# Patient Record
Sex: Male | Born: 1985 | Race: White | Hispanic: No | Marital: Single | Smoking: Former smoker
Health system: Southern US, Community
[De-identification: ages and names within clinical notes are randomized; demographics above are authoritative.]

---

## 2007-02-06 ENCOUNTER — Emergency Department: Payer: Self-pay | Admitting: Emergency Medicine

## 2008-10-01 ENCOUNTER — Emergency Department: Payer: Self-pay | Admitting: Emergency Medicine

## 2011-02-14 ENCOUNTER — Emergency Department: Payer: Self-pay | Admitting: Emergency Medicine

## 2012-01-09 ENCOUNTER — Emergency Department: Payer: Self-pay | Admitting: *Deleted

## 2012-01-09 LAB — COMPREHENSIVE METABOLIC PANEL
Albumin: 4.8 g/dL (ref 3.4–5.0)
BUN: 15 mg/dL (ref 7–18)
Bilirubin,Total: 0.4 mg/dL (ref 0.2–1.0)
Chloride: 102 mmol/L (ref 98–107)
Creatinine: 0.94 mg/dL (ref 0.60–1.30)
EGFR (Non-African Amer.): >60
Osmolality: 282 (ref 275–301)
Potassium: 3.5 mmol/L (ref 3.5–5.1)
SGOT(AST): 22 U/L (ref 15–37)
SGPT (ALT): 26 U/L
Sodium: 140 mmol/L (ref 136–145)
Total Protein: 8.3 g/dL — ABNORMAL HIGH (ref 6.4–8.2)

## 2012-01-09 LAB — LIPASE, BLOOD: Lipase: 59 U/L — ABNORMAL LOW (ref 73–393)

## 2012-01-09 LAB — CBC WITH DIFFERENTIAL/PLATELET
Basophil #: 0 10*3/uL (ref 0.0–0.1)
Basophil %: 0.2 %
Eosinophil #: 0 10*3/uL (ref 0.0–0.7)
HCT: 45.1 % (ref 40.0–52.0)
HGB: 15.3 g/dL (ref 13.0–18.0)
Lymphocyte #: 1.1 10*3/uL (ref 1.0–3.6)
Lymphocyte %: 7.8 %
MCHC: 34 g/dL (ref 32.0–36.0)
Monocyte #: 0.9 10*3/uL — ABNORMAL HIGH (ref 0.0–0.7)
Monocyte %: 6.3 %
Neutrophil #: 12 10*3/uL — ABNORMAL HIGH (ref 1.4–6.5)
Neutrophil %: 85.5 %
WBC: 14 10*3/uL — ABNORMAL HIGH (ref 3.8–10.6)

## 2012-01-10 LAB — URINALYSIS, COMPLETE
Leukocyte Esterase: NEGATIVE
Nitrite: NEGATIVE
Protein: 30
RBC,UR: 7 /HPF (ref 0–5)
Squamous Epithelial: NONE SEEN
WBC UR: 2 /HPF (ref 0–5)

## 2012-04-05 ENCOUNTER — Emergency Department: Payer: Self-pay | Admitting: Emergency Medicine

## 2014-06-13 ENCOUNTER — Emergency Department: Payer: Self-pay | Admitting: Emergency Medicine

## 2016-09-05 ENCOUNTER — Emergency Department: Payer: No Typology Code available for payment source

## 2016-09-05 ENCOUNTER — Emergency Department
Admission: EM | Admit: 2016-09-05 | Discharge: 2016-09-05 | Disposition: A | Discharge status: Home / Self Care | Payer: No Typology Code available for payment source | Attending: Emergency Medicine | Admitting: Emergency Medicine

## 2016-09-05 DIAGNOSIS — S70211A Abrasion, right hip, initial encounter: Secondary | ICD-10-CM | POA: Insufficient documentation

## 2016-09-05 DIAGNOSIS — S50812A Abrasion of left forearm, initial encounter: Secondary | ICD-10-CM | POA: Insufficient documentation

## 2016-09-05 DIAGNOSIS — S20312A Abrasion of left front wall of thorax, initial encounter: Secondary | ICD-10-CM | POA: Diagnosis not present

## 2016-09-05 DIAGNOSIS — Y9389 Activity, other specified: Secondary | ICD-10-CM | POA: Insufficient documentation

## 2016-09-05 DIAGNOSIS — Y999 Unspecified external cause status: Secondary | ICD-10-CM | POA: Insufficient documentation

## 2016-09-05 DIAGNOSIS — Y92410 Unspecified street and highway as the place of occurrence of the external cause: Secondary | ICD-10-CM | POA: Diagnosis not present

## 2016-09-05 DIAGNOSIS — R0789 Other chest pain: Secondary | ICD-10-CM

## 2016-09-05 DIAGNOSIS — S59912A Unspecified injury of left forearm, initial encounter: Secondary | ICD-10-CM | POA: Diagnosis present

## 2016-09-05 DIAGNOSIS — S50811A Abrasion of right forearm, initial encounter: Secondary | ICD-10-CM | POA: Diagnosis not present

## 2016-09-05 DIAGNOSIS — T148XXA Other injury of unspecified body region, initial encounter: Secondary | ICD-10-CM

## 2016-09-05 MED ORDER — IBUPROFEN 600 MG PO TABS: 600.0000 mg | ORAL_TABLET | Freq: Three times a day (TID) | ORAL | 0 refills | Status: AC | PRN

## 2016-09-05 MED ORDER — IBUPROFEN 600 MG PO TABS
600.0000 mg | ORAL_TABLET | Freq: Once | ORAL | Status: AC
  Administered 2016-09-05: 600 mg via ORAL
  Filled 2016-09-05: qty 1

## 2016-09-05 MED ORDER — HYDROCODONE-ACETAMINOPHEN 5-325 MG PO TABS
1.0000 | ORAL_TABLET | Freq: Once | ORAL | Status: AC
  Administered 2016-09-05: 1 via ORAL
  Filled 2016-09-05: qty 1

## 2016-09-05 MED ORDER — HYDROCODONE-ACETAMINOPHEN 5-325 MG PO TABS: 1.0000 | ORAL_TABLET | Freq: Four times a day (QID) | ORAL | 0 refills | Status: AC | PRN

## 2016-09-05 NOTE — Discharge Instructions (Signed)
1.  You may take pain medicines as needed (Motrin/Norco #15). °2.  Return to the ER for worsening symptoms, persistent vomiting, difficulty breathing or other concerns. °
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
## 2016-09-05 NOTE — ED Notes (Signed)
Pt calling family for ride 
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
## 2017-05-02 ENCOUNTER — Emergency Department
Admission: EM | Admit: 2017-05-02 | Discharge: 2017-05-02 | Disposition: A | Discharge status: Home / Self Care | Payer: Self-pay | Attending: Emergency Medicine | Admitting: Emergency Medicine

## 2017-05-02 ENCOUNTER — Emergency Department: Payer: Self-pay

## 2017-05-02 ENCOUNTER — Encounter: Payer: Self-pay | Admitting: Emergency Medicine

## 2017-05-02 DIAGNOSIS — R609 Edema, unspecified: Secondary | ICD-10-CM

## 2017-05-02 DIAGNOSIS — F17290 Nicotine dependence, other tobacco product, uncomplicated: Secondary | ICD-10-CM | POA: Insufficient documentation

## 2017-05-02 DIAGNOSIS — Z791 Long term (current) use of non-steroidal anti-inflammatories (NSAID): Secondary | ICD-10-CM | POA: Insufficient documentation

## 2017-05-02 DIAGNOSIS — R600 Localized edema: Secondary | ICD-10-CM | POA: Insufficient documentation

## 2017-05-02 DIAGNOSIS — Z79899 Other long term (current) drug therapy: Secondary | ICD-10-CM | POA: Insufficient documentation

## 2017-05-02 LAB — CBC WITH DIFFERENTIAL/PLATELET
BASOS ABS: 0 10*3/uL (ref 0–0.1)
Basophils Relative: 1 %
Eosinophils Absolute: 0.4 10*3/uL (ref 0–0.7)
Eosinophils Relative: 7 %
HEMATOCRIT: 44.6 % (ref 40.0–52.0)
Hemoglobin: 15.3 g/dL (ref 13.0–18.0)
LYMPHS PCT: 32 %
Lymphs Abs: 1.8 10*3/uL (ref 1.0–3.6)
MCH: 30.3 pg (ref 26.0–34.0)
MCHC: 34.3 g/dL (ref 32.0–36.0)
MCV: 88.3 fL (ref 80.0–100.0)
MONO ABS: 0.5 10*3/uL (ref 0.2–1.0)
MONOS PCT: 10 %
NEUTROS ABS: 2.8 10*3/uL (ref 1.4–6.5)
Neutrophils Relative %: 50 %
Platelets: 209 10*3/uL (ref 150–440)
RBC: 5.05 MIL/uL (ref 4.40–5.90)
RDW: 13.3 % (ref 11.5–14.5)
WBC: 5.5 10*3/uL (ref 3.8–10.6)

## 2017-05-02 LAB — BASIC METABOLIC PANEL
ANION GAP: 7 (ref 5–15)
BUN: 10 mg/dL (ref 6–20)
CALCIUM: 9.3 mg/dL (ref 8.9–10.3)
CHLORIDE: 105 mmol/L (ref 101–111)
CO2: 29 mmol/L (ref 22–32)
Creatinine, Ser: 0.95 mg/dL (ref 0.61–1.24)
GFR calc Af Amer: >60 mL/min (ref >60)
GFR calc non Af Amer: >60 mL/min (ref >60)
GLUCOSE: 87 mg/dL (ref 65–99)
Potassium: 3.9 mmol/L (ref 3.5–5.1)
Sodium: 141 mmol/L (ref 135–145)

## 2017-05-02 LAB — CK: Total CK: 134 U/L (ref 49–397)

## 2017-05-02 NOTE — ED Triage Notes (Addendum)
Pt presents to ED with intermittent swelling to his left lower leg for the past week. reddened areas noted to the back of both legs and front of left ankle. Pt states both legs do swell occasionally but reports mostly the left leg is affected. No redness or heat noted to affected leg. Swelling present.

## 2017-05-02 NOTE — Discharge Instructions (Signed)
Elevate leg whenever possible to reduce swelling. Return to the ER for worsening symptoms, difficulty breathing, redness or streaking to leg, or other concerns.

## 2017-05-02 NOTE — ED Provider Notes (Signed)
[facility] Emergency Department Provider Note   ____________________________________________   First MD Initiated Contact with Patient 05/02/17 0401     (approximate)  I have reviewed the triage vital signs and the nursing notes.   HISTORY  Chief Complaint Leg Swelling    HPI Trevor Melendez is a 31 y.o. male who presents to the ED from home with a chief complaint of left lower leg swelling.Patient reports intermittent swelling for the past week. Has had bug bites to both legs and calves, but the left one seems to be more affected. Denies associated fever, chills, chest pain, shortness of breath, abdominal pain, nausea, vomiting, diarrhea. Denies recent travel or trauma. Denies taking creatine supplements.   Past medical history None  There are no active problems to display for this patient.   History reviewed. No pertinent surgical history.  Prior to Admission medications   Medication Sig Start Date End Date Taking? Authorizing Provider  HYDROcodone-acetaminophen (NORCO) 5-325 MG tablet Take 1 tablet by mouth every 6 (six) hours as needed for moderate pain. 09/05/16   Irean HongSung, Jiovany Scheffel J, MD  ibuprofen (ADVIL,MOTRIN) 600 MG tablet Take 1 tablet (600 mg total) by mouth every 8 (eight) hours as needed. 09/05/16   Irean HongSung, Suhaan Perleberg J, MD    Allergies Patient has no known allergies.  No family history on file.  Social History Social History  Substance Use Topics  . Smoking status: Current Every Day Smoker    Types: E-cigarettes  . Smokeless tobacco: Not on file  . Alcohol use No    Review of Systems  Constitutional: No fever/chills. Eyes: No visual changes. ENT: No sore throat. Cardiovascular: Denies chest pain. Respiratory: Denies shortness of breath. Gastrointestinal: No abdominal pain.  No nausea, no vomiting.  No diarrhea.  No constipation. Genitourinary: Negative for dysuria. Musculoskeletal: Positive for left leg pain and swelling. Negative  for back pain. Skin: Negative for rash. Neurological: Negative for headaches, focal weakness or numbness.   ____________________________________________   PHYSICAL EXAM:  VITAL SIGNS: ED Triage Vitals  Enc Vitals Group     BP 05/02/17 0307 (!) 144/73     Pulse Rate 05/02/17 0307 73     Resp 05/02/17 0307 18     Temp 05/02/17 0307 98 F (36.7 C)     Temp Source 05/02/17 0307 Oral     SpO2 05/02/17 0307 97 %     Weight 05/02/17 0308 180 lb (81.6 kg)     Height 05/02/17 0308 6\' 3"  (1.905 m)     Head Circumference --      Peak Flow --      Pain Score 05/02/17 0346 1     Pain Loc --      Pain Edu? --      Excl. in GC? --     Constitutional: Alert and oriented. Well appearing and in no acute distress. Eyes: Conjunctivae are normal. PERRL. EOMI. Head: Atraumatic. Nose: No congestion/rhinnorhea. Mouth/Throat: Mucous membranes are moist.  Oropharynx non-erythematous. Neck: No stridor.   Cardiovascular: Normal rate, regular rhythm. Grossly normal heart sounds.  Good peripheral circulation. Respiratory: Normal respiratory effort.  No retractions. Lungs CTAB. Gastrointestinal: Soft and nontender. No distention. No abdominal bruits. No CVA tenderness. Musculoskeletal:  Right calf: 38 cm Left calf: 39 cm. Calf is supple but tender to palpation. No warmth or erythema. 2+ distal pulses. Neurologic:  Normal speech and language. No gross focal neurologic deficits are appreciated.  Skin:  Skin is warm, dry and intact.  No rash noted. Psychiatric: Mood and affect are normal. Speech and behavior are normal.  ____________________________________________   LABS (all labs ordered are listed, but only abnormal results are displayed)  Labs Reviewed  CBC WITH DIFFERENTIAL/PLATELET  BASIC METABOLIC PANEL  CK   ____________________________________________  EKG  None ____________________________________________  RADIOLOGY  US Venous Img Lower Unilateral Left  Result Date:  05/02/2017 CLINICAL DATA:  LEFT calf pain and swelling for 1 week. EXAM: LEFT LOWER EXTREMITY VENOUS DOPPLER ULTRASOUND TECHNIQUE: Gray-scale sonography with graded compression, as well as color Doppler and duplex ultrasound were performed to evaluate the lower extremity deep venous systems from the level of the common femoral vein and including the common femoral, femoral, profunda femoral, popliteal and calf veins including the posterior tibial, peroneal and gastrocnemius veins when visible. The superficial great saphenous vein was also interrogated. Spectral Doppler was utilized to evaluate flow at rest and with distal augmentation maneuvers in the common femoral, femoral and popliteal veins. COMPARISON:  None. FINDINGS: Contralateral Common Femoral Vein: Respiratory phasicity is normal and symmetric with the symptomatic side. No evidence of thrombus. Normal compressibility. Common Femoral Vein: No evidence of thrombus. Normal compressibility, respiratory phasicity and response to augmentation. Saphenofemoral Junction: No evidence of thrombus. Normal compressibility and flow on color Doppler imaging. Profunda Femoral Vein: No evidence of thrombus. Normal compressibility and flow on color Doppler imaging. Femoral Vein (duplicated): No evidence of thrombus. Normal compressibility, respiratory phasicity and response to augmentation. Popliteal Vein: No evidence of thrombus. Normal compressibility, respiratory phasicity and response to augmentation. Calf Veins: No evidence of thrombus. Normal compressibility and flow on color Doppler imaging. Superficial Great Saphenous Vein: No evidence of thrombus. Normal compressibility and flow on color Doppler imaging. Other Findings:  None. IMPRESSION: No LEFT lower extremity deep vein thrombosis. Electronically Signed   By: Awilda Metro M.D.   On: 05/02/2017 05:33    ____________________________________________   PROCEDURES  Procedure(s) performed:  None  Procedures  Critical Care performed: No  ____________________________________________   INITIAL IMPRESSION / ASSESSMENT AND PLAN / ED COURSE  Pertinent labs & imaging results that were available during my care of the patient were reviewed by me and considered in my medical decision making (see chart for details).  31 year old male who presents with left lower leg pain and swelling. Will check screening lab work including CK and obtain Doppler ultrasound to evaluate for DVT.  Clinical Course as of May 02 732  Tue May 02, 2017  0636 Updated patient of laboratory imaging results. Reexamined left leg; there are several insect bites but none appear to be infected. Do not feel antibiotics are warranted at this time. Advised patient to elevate his extremity whenever possible and to follow-up with his PCP closely. Strict return precautions given. Patient verbalizes understanding and agrees with plan of care.  [JS]    Clinical Course User Index [JS] Irean Hong, MD     ____________________________________________   FINAL CLINICAL IMPRESSION(S) / ED DIAGNOSES  Final diagnoses:  Peripheral edema      NEW MEDICATIONS STARTED DURING THIS VISIT:  Discharge Medication List as of 05/02/2017  6:38 AM       Note:  This document was prepared using Dragon voice recognition software and may include unintentional dictation errors.    Irean Hong, MD 05/02/17 [phone]

## 2017-05-02 NOTE — ED Notes (Signed)
Pt discharged to home.  Family member driving.  Discharge instructions reviewed.  Verbalized understanding.  No questions or concerns at this time.  Teach back verified.  Pt in NAD.  No items left in ED.   

## 2018-05-24 IMAGING — US US EXTREM LOW VENOUS*L*
1 series · 13 of 24 positions shown · non-contrast
Comparison: None.

CLINICAL DATA: LEFT calf pain and swelling for 1 week.



[Series 1: us extrem low venous*left* · 0.06mm/px · 13 of 38 slices shown]
[im 1/38]
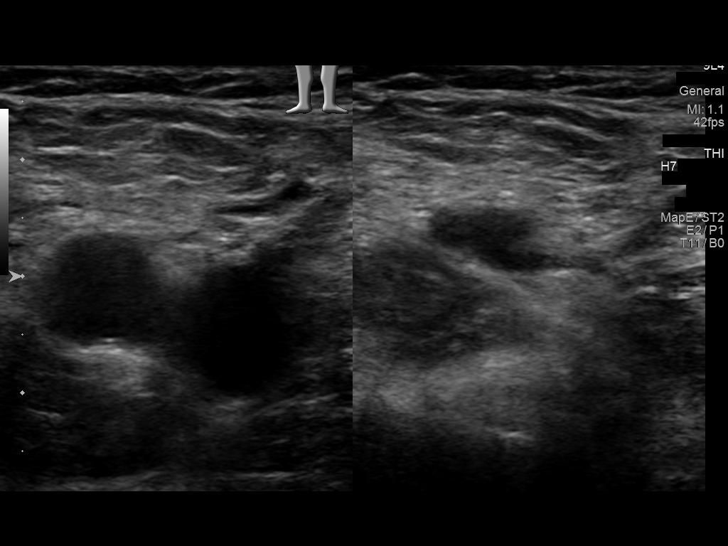
[im 4/38]
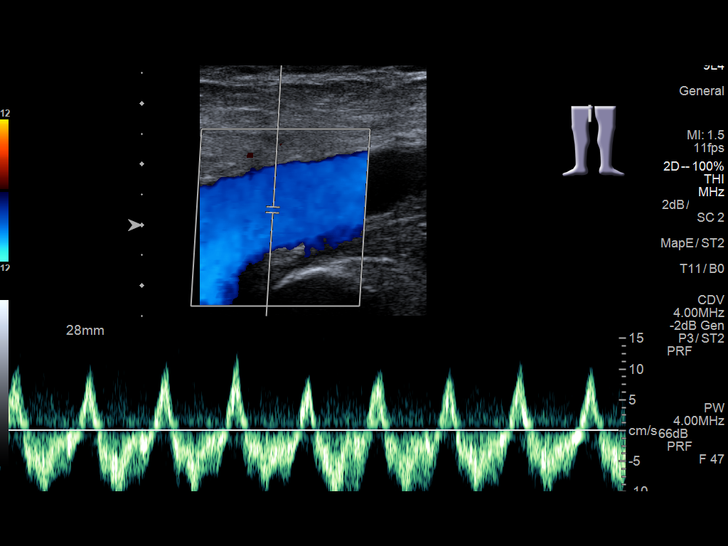
[im 7/38]
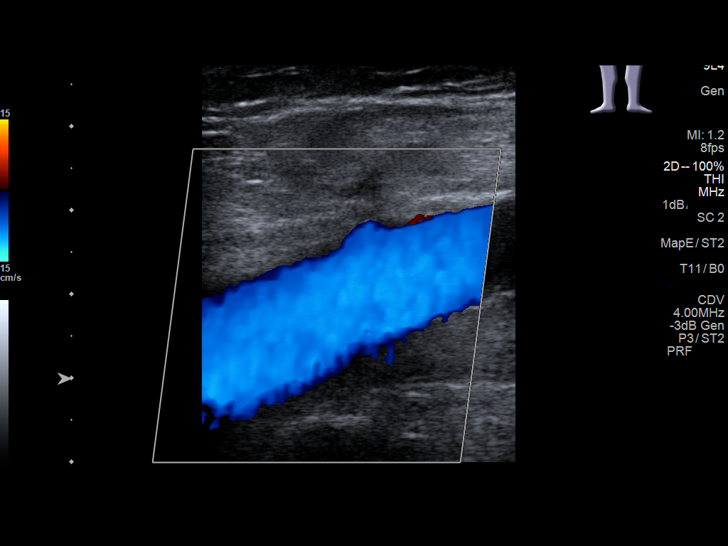
[im 10/38]
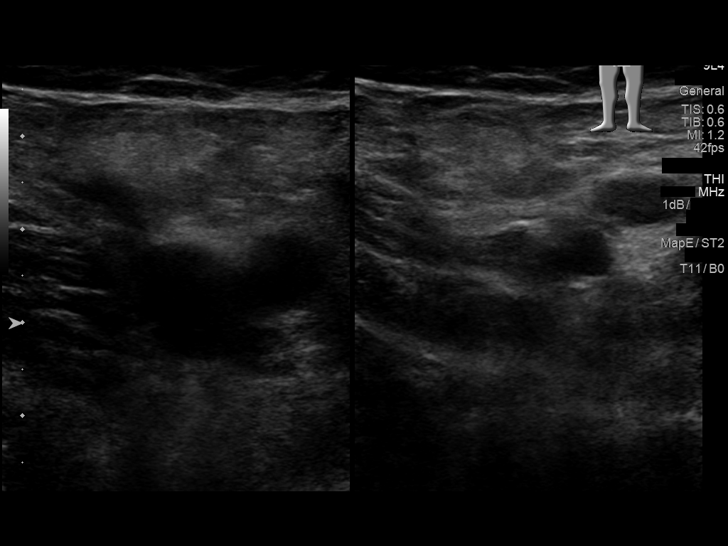
[im 13/38]
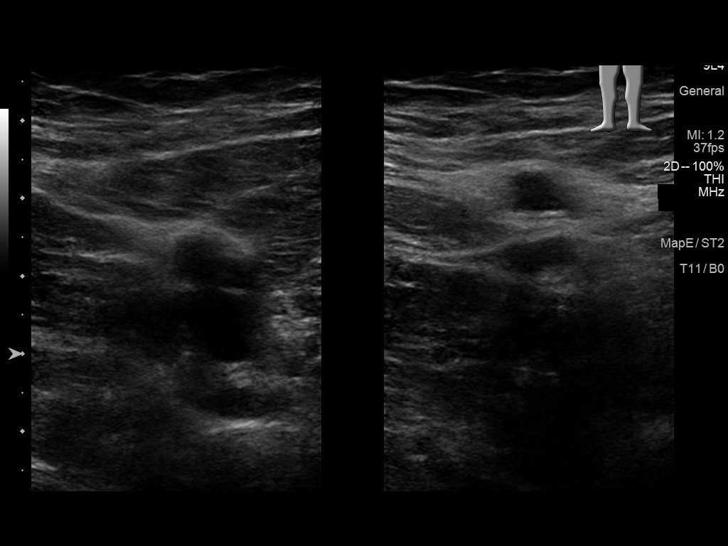
[im 17/38]
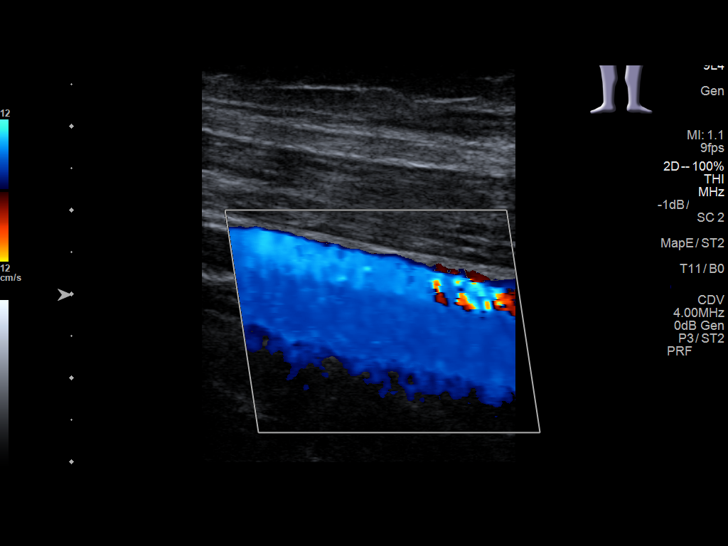
[im 20/38]
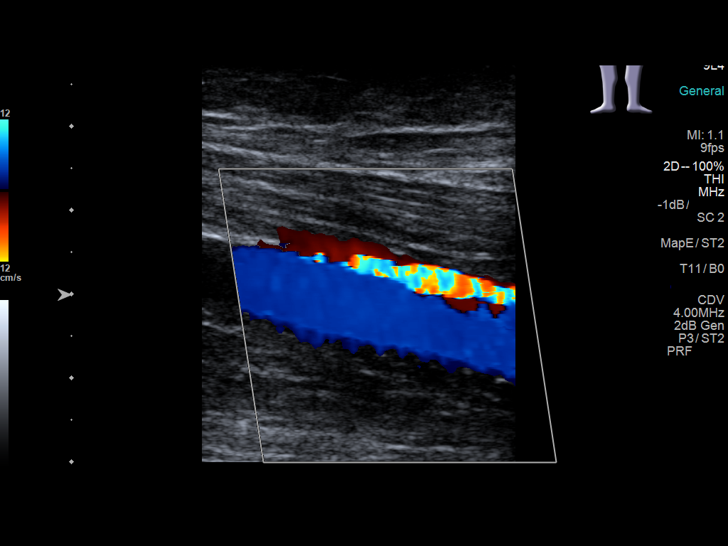
[im 21/38]
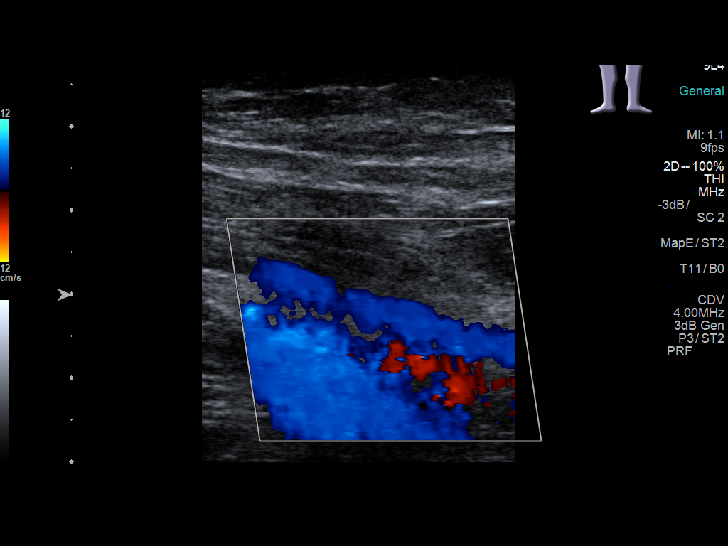
[im 25/38]
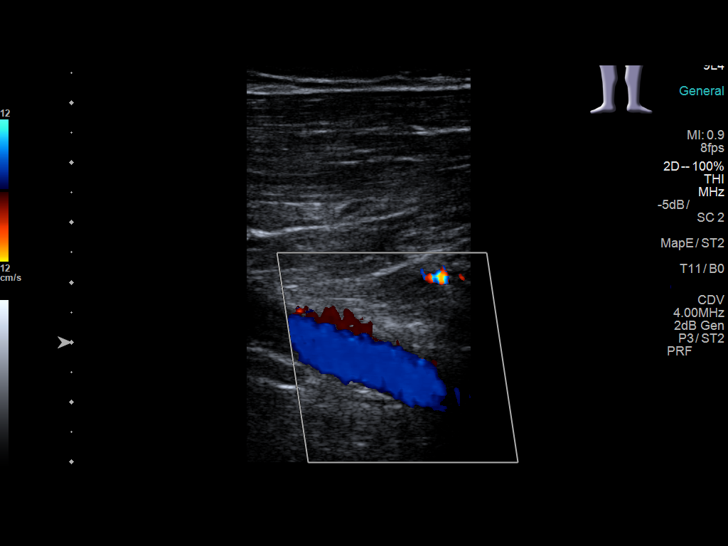
[im 28/38]
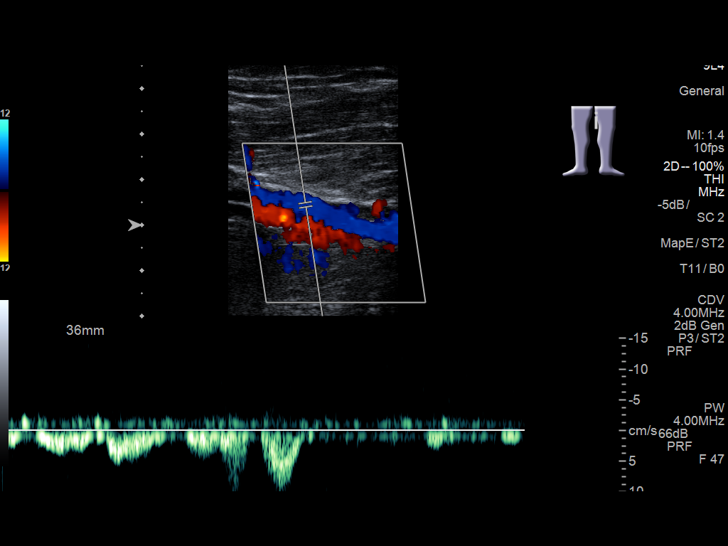
[im 31/38]
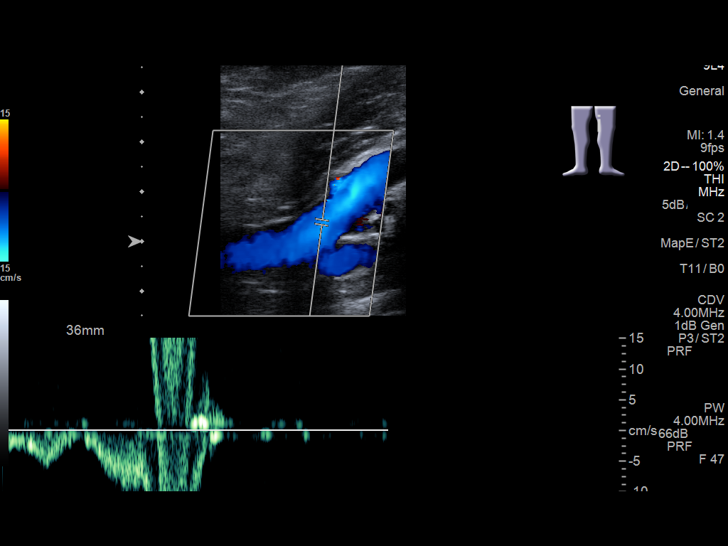
[im 34/38]
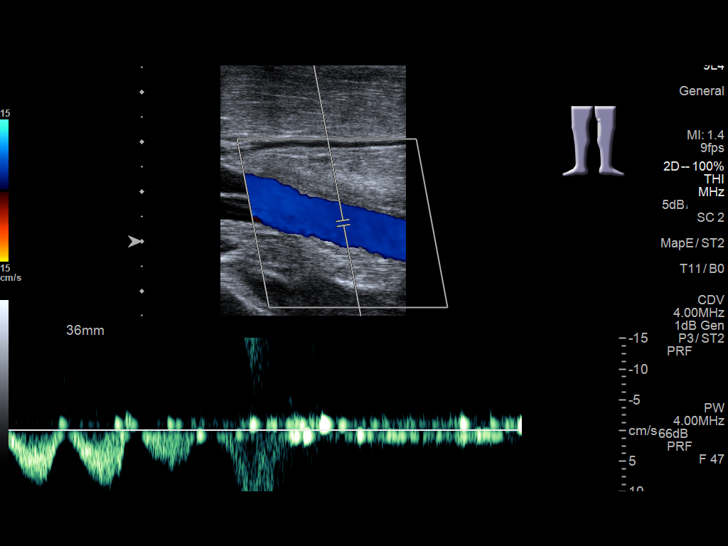
[im 38/38]
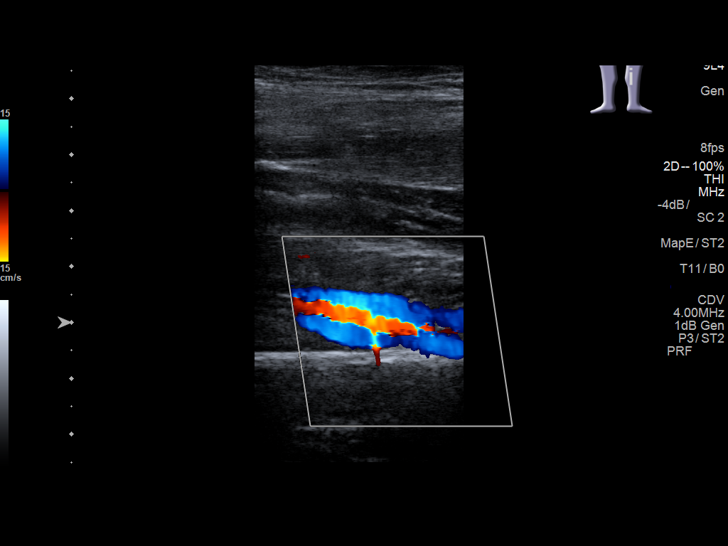

[13 of 24 positions shown; findings below may reference images not displayed]

FINDINGS: Contralateral Common Femoral Vein: Respiratory phasicity is normal
and symmetric with the symptomatic side. No evidence of thrombus.
Normal compressibility.

Common Femoral Vein: No evidence of thrombus. Normal
compressibility, respiratory phasicity and response to augmentation.

Saphenofemoral Junction: No evidence of thrombus. Normal
compressibility and flow on color Doppler imaging.

Profunda Femoral Vein: No evidence of thrombus. Normal
compressibility and flow on color Doppler imaging.

Femoral Vein (duplicated): No evidence of thrombus. Normal
compressibility, respiratory phasicity and response to augmentation.

Popliteal Vein: No evidence of thrombus. Normal compressibility,
respiratory phasicity and response to augmentation.

Calf Veins: No evidence of thrombus. Normal compressibility and flow
on color Doppler imaging.

Superficial Great Saphenous Vein: No evidence of thrombus. Normal
compressibility and flow on color Doppler imaging.

Other Findings:  None.
IMPRESSION: No LEFT lower extremity deep vein thrombosis.

## 2019-03-19 ENCOUNTER — Emergency Department
Admission: EM | Admit: 2019-03-19 | Discharge: 2019-03-19 | Disposition: A | Discharge status: Home / Self Care | Payer: HRSA Program | Attending: Emergency Medicine | Admitting: Emergency Medicine

## 2019-03-19 ENCOUNTER — Emergency Department: Payer: HRSA Program

## 2019-03-19 ENCOUNTER — Other Ambulatory Visit: Payer: Self-pay

## 2019-03-19 ENCOUNTER — Encounter: Payer: Self-pay | Admitting: Emergency Medicine

## 2019-03-19 DIAGNOSIS — R059 Cough, unspecified: Secondary | ICD-10-CM

## 2019-03-19 DIAGNOSIS — B9789 Other viral agents as the cause of diseases classified elsewhere: Secondary | ICD-10-CM

## 2019-03-19 DIAGNOSIS — F17210 Nicotine dependence, cigarettes, uncomplicated: Secondary | ICD-10-CM | POA: Insufficient documentation

## 2019-03-19 DIAGNOSIS — R05 Cough: Secondary | ICD-10-CM | POA: Diagnosis not present

## 2019-03-19 DIAGNOSIS — J029 Acute pharyngitis, unspecified: Secondary | ICD-10-CM | POA: Diagnosis present

## 2019-03-19 DIAGNOSIS — J069 Acute upper respiratory infection, unspecified: Secondary | ICD-10-CM

## 2019-03-19 DIAGNOSIS — Z20828 Contact with and (suspected) exposure to other viral communicable diseases: Secondary | ICD-10-CM | POA: Insufficient documentation

## 2019-03-19 NOTE — Discharge Instructions (Signed)
Coronavirus (COVID-19) Are you at risk?  Are you at risk for the Coronavirus (COVID-19)?  To be considered HIGH RISK for Coronavirus (COVID-19), you have to meet the following criteria:  Traveled to [place], [place], [place], [place] or [place]; or in the [place] to [place], [place], [place], or [place]; and have fever, cough, and shortness of breath within the last 2 weeks of travel OR Been in close contact with a person diagnosed with COVID-19 within the last 2 weeks and have fever, cough, and shortness of breath IF YOU DO NOT MEET THESE CRITERIA, YOU ARE CONSIDERED LOW RISK FOR COVID-19.  What to do if you are HIGH RISK for COVID-19?  If you are having a medical emergency, call 911. Seek medical care right away. Before you go to a doctors office, urgent care or emergency department, call ahead and tell them about your recent travel, contact with someone diagnosed with COVID-19, and your symptoms. You should receive instructions from your physicians office regarding next steps of care.  When you arrive at healthcare provider, tell the healthcare staff immediately you have returned from visiting [place], [place], [place], [place] or [place]; or traveled in the [place] to [place], [place], [place], or [place]; in the last two weeks or you have been in close contact with a person diagnosed with COVID-19 in the last 2 weeks.   Tell the health care staff about your symptoms: fever, cough and shortness of breath. After you have been seen by a medical provider, you will be either: Tested for (COVID-19) and discharged home on quarantine except to seek medical care if symptoms worsen, and asked to  Stay home and avoid contact with others until you get your results (4-5 days)  Avoid travel on public transportation if possible (such as bus, train, or airplane) or Sent to the Emergency Department by EMS for evaluation, COVID-19 testing, and possible admission depending on your  condition and test results.  What to do if you are LOW RISK for COVID-19?  Reduce your risk of any infection by using the same precautions used for avoiding the common cold or flu:  Wash your hands often with soap and warm water for at least 20 seconds.  If soap and water are not readily available, use an alcohol-based hand sanitizer with at least 60% alcohol.  If coughing or sneezing, cover your mouth and nose by coughing or sneezing into the elbow areas of your shirt or coat, into a tissue or into your sleeve (not your hands). Avoid shaking hands with others and consider head nods or verbal greetings only. Avoid touching your eyes, nose, or mouth with unwashed hands.  Avoid close contact with people who are sick. Avoid places or events with large numbers of people in one location, like concerts or sporting events. Carefully consider travel plans you have or are making. If you are planning any travel outside or inside the [place], visit the [facility] webpage for the latest health notices. If you have some symptoms but not all symptoms, continue to monitor at home and seek medical attention if your symptoms worsen. If you are having a medical emergency, call 911.   ADDITIONAL HEALTHCARE OPTIONS FOR PATIENTS  [facility] / e-Visit: [email or web address]         [facility]: [phone]  [facility]: [phone]        If you are positive on your testing from here, we would asked  that you quarantine yourself for the next 2 weeks.  Please follow-up with medical records for your results.  Tylenol for your symptoms if you have any change in your          [facility]: [phone]

## 2019-03-19 NOTE — ED Notes (Signed)
Pt verbalized understanding of d/c instructions, and f/u care. No further questions at this time. Pt ambulatory to the exit with steady gait.  

## 2019-03-19 NOTE — ED Provider Notes (Addendum)
[facility] Emergency Department Provider Note  ____________________________________________   I have reviewed the triage vital signs and the nursing notes. Where available I have reviewed prior notes and, if possible and indicated, outside hospital notes.    HISTORY  Chief Complaint Sore Throat    HPI Trevor Melendez is a 33 y.o. male  He has baseline health he woke up this morning with a slight sore throat and slight cough slight runny nose and slight headache.  No worsening of life gradual onset, no ocular visual changes.  No throat swelling no difficulty speaking no difficulty swallowing, positive postnasal drip he states.  No known coronavirus contacts.  No fevers no chills no body aches no other alleviating or aggravating factors no other prior treatment or concerns    History reviewed. No pertinent past medical history.  There are no active problems to display for this patient.   History reviewed. No pertinent surgical history.  Prior to Admission medications   Medication Sig Start Date End Date Taking? Authorizing Provider  HYDROcodone-acetaminophen (NORCO) 5-325 MG tablet Take 1 tablet by mouth every 6 (six) hours as needed for moderate pain. 09/05/16   Irean Hong, MD  ibuprofen (ADVIL,MOTRIN) 600 MG tablet Take 1 tablet (600 mg total) by mouth every 8 (eight) hours as needed. 09/05/16   Irean Hong, MD    Allergies Patient has no known allergies.  No family history on file.  Social History Social History   Tobacco Use  . Smoking status: Current Every Day Smoker    Types: E-cigarettes  . Smokeless tobacco: Current User  Substance Use Topics  . Alcohol use: No  . Drug use: No    Review of Systems Constitutional: No fever/chills Eyes: No visual changes. ENT: + sore throat. No stiff neck no neck pain Cardiovascular: Denies chest pain. Respiratory: Denies shortness of breath. Gastrointestinal:   no vomiting.  No diarrhea.  No  constipation. Genitourinary: Negative for dysuria. Musculoskeletal: Negative lower extremity swelling Skin: Negative for rash. Neurological: Negative for severe headaches, focal weakness or numbness.   ____________________________________________   PHYSICAL EXAM:  VITAL SIGNS: ED Triage Vitals  Enc Vitals Group     BP 03/19/19 1900 (!) 159/71     Pulse Rate 03/19/19 1900 (!) 101     Resp --      Temp 03/19/19 1900 99.2 F (37.3 C)     Temp Source 03/19/19 1900 Oral     SpO2 03/19/19 1900 98 %     Weight 03/19/19 1900 195 lb (88.5 kg)     Height 03/19/19 1900 6\' 3"  (1.905 m)     Head Circumference --      Peak Flow --      Pain Score 03/19/19 1905 0     Pain Loc --      Pain Edu? --      Excl. in GC? --     Constitutional: Alert and oriented. Well appearing and in no acute distress. Eyes: Conjunctivae are normal Head: Atraumatic HEENT: Positive mild congestion/rhinnorhea. Mucous membranes are moist.  Oropharynx non-erythematous slight cobblestoning noted Neck:   Nontender with no meningismus, no masses, no stridor Cardiovascular: Normal rate, 92/min regular rhythm. Grossly normal heart sounds.  Good peripheral circulation. Respiratory: Normal respiratory effort.  No retractions. Lungs CTAB. Abdominal: Soft and nontender. No distention. No guarding no rebound Back:  There is no focal tenderness or step off.  there is no midline tenderness there are no lesions noted. there  is no CVA tenderness Musculoskeletal: No lower extremity tenderness, no upper extremity tenderness. No joint effusions, no DVT signs strong distal pulses no edema Neurologic:  Normal speech and language. No gross focal neurologic deficits are appreciated.  Skin:  Skin is warm, dry and intact. No rash noted. Psychiatric: Mood and affect are normal. Speech and behavior are normal.  ____________________________________________   LABS (all labs ordered are listed, but only abnormal results are  displayed)  Labs Reviewed  NOVEL CORONAVIRUS, NAA (HOSPITAL ORDER, SEND-OUT TO REF LAB)    Pertinent labs  results that were available during my care of the patient were reviewed by me and considered in my medical decision making (see chart for details). ____________________________________________  EKG  I personally interpreted any EKGs ordered by me or triage  ____________________________________________  RADIOLOGY  Pertinent labs & imaging results that were available during my care of the patient were reviewed by me and considered in my medical decision making (see chart for details). If possible, patient and/or family made aware of any abnormal findings.  No results found. ____________________________________________    PROCEDURES  Procedure(s) performed: None  Procedures  Critical Care performed: None  ____________________________________________   INITIAL IMPRESSION / ASSESSMENT AND PLAN / ED COURSE  Pertinent labs & imaging results that were available during my care of the patient were reviewed by me and considered in my medical decision making (see chart for details).  Patient here with no evidence of bacterial infection including strep throat, he has mild URI symptoms which appear to be viral, however this is the time of the coronavirus therefore we will send an outpatient test for him.  Does not meet criteria for admission we will also get a chest x-ray for the same reason.  Sats are 99% while I am in the room and he is in no acute distress.  I do not see any indication for admission and I do not believe with a runny nose and a cough and no erythema or exudate in his throat that a strep test is warranted.  Trevor Melendez was evaluated in Emergency Department on 03/19/2019 for the symptoms described in the history of present illness. He was evaluated in the context of the global COVID-19 pandemic, which necessitated consideration that the patient might be at risk for  infection with the SARS-CoV-2 virus that causes COVID-19. Institutional protocols and algorithms that pertain to the evaluation of patients at risk for COVID-19 are in a state of rapid change based on information released by regulatory bodies including the CDC and federal and state organizations. These policies and algorithms were followed during the patient's care in the ED.    ____________________________________________   FINAL CLINICAL IMPRESSION(S) / ED DIAGNOSES  Final diagnoses:  Cough      This chart was dictated using voice recognition software.  Despite best efforts to proofread,  errors can occur which can change meaning.      Jeanmarie PlantMcShane, Oris Staffieri A, MD 03/19/19 1934    Jeanmarie PlantMcShane, Demetric Parslow A, MD 03/19/19 [phone]

## 2019-03-19 NOTE — ED Triage Notes (Signed)
Patient ambulatory to triage with steady gait, without difficulty or distress noted, mask in place; pt reports sore throat and nonprod cough since over weekend

## 2019-03-21 ENCOUNTER — Telehealth: Payer: Self-pay | Admitting: Emergency Medicine

## 2019-03-21 LAB — NOVEL CORONAVIRUS, NAA (HOSP ORDER, SEND-OUT TO REF LAB; TAT 18-24 HRS): SARS-CoV-2, NAA: NOT DETECTED

## 2019-03-21 NOTE — Telephone Encounter (Signed)
Called and gave patient covid 19 result.

## 2020-10-06 IMAGING — DX PORTABLE CHEST - 1 VIEW
1 series · 1 of 1 positions shown · non-contrast
Comparison: Portable exam 0416 hours compared to 09/05/2016

CLINICAL DATA: Sore throat nonproductive cough for 3-4 days,
history of vaping

EXAM:
PORTABLE CHEST 1 VIEW

[chest ap]
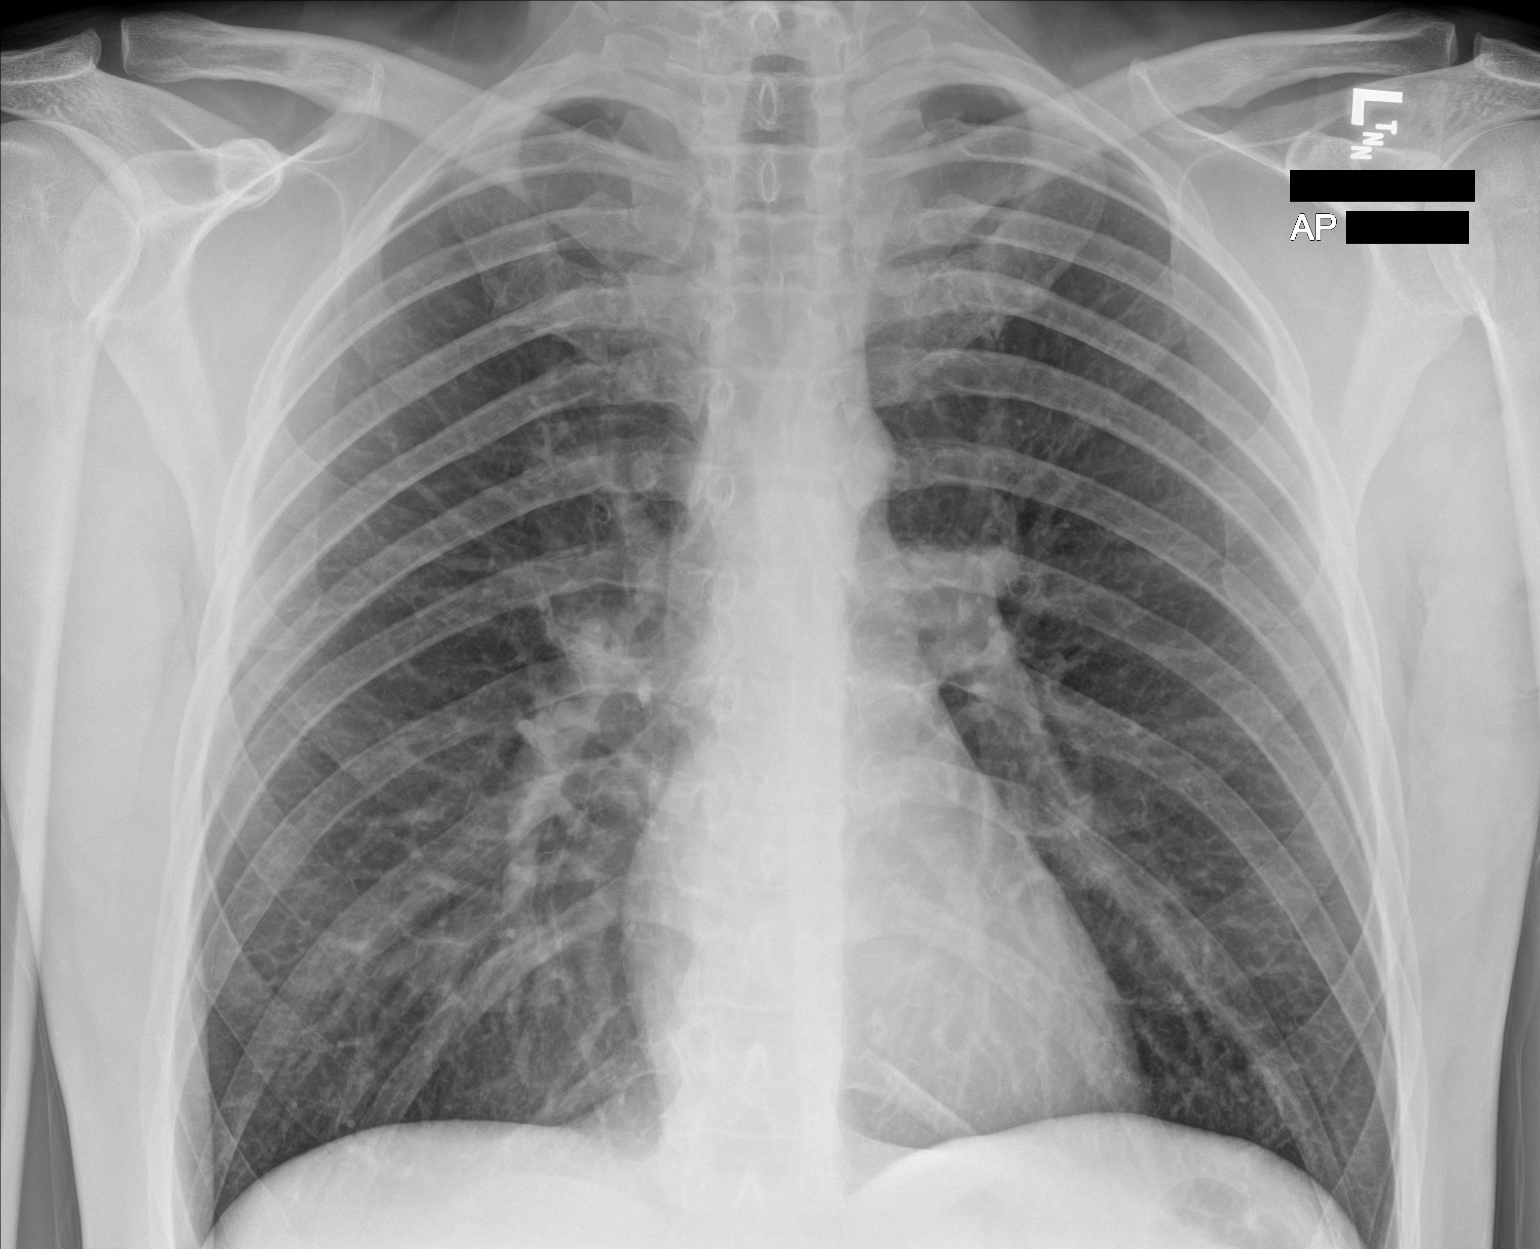

[1 of 1 positions shown; findings below may reference images not displayed]

FINDINGS: Normal heart size, mediastinal contours, and pulmonary vascularity.

Lungs clear.

No pulmonary infiltrate, pleural effusion or pneumothorax.

Bones unremarkable.
IMPRESSION: No acute abnormalities.

## 2021-05-04 ENCOUNTER — Other Ambulatory Visit: Payer: Self-pay

## 2023-03-12 ENCOUNTER — Emergency Department: Payer: Self-pay

## 2023-03-12 ENCOUNTER — Other Ambulatory Visit: Payer: Self-pay

## 2023-03-12 ENCOUNTER — Emergency Department
Admission: EM | Admit: 2023-03-12 | Discharge: 2023-03-12 | Disposition: A | Discharge status: Home / Self Care | Payer: Self-pay | Attending: Emergency Medicine | Admitting: Emergency Medicine

## 2023-03-12 ENCOUNTER — Encounter: Payer: Self-pay | Admitting: Intensive Care

## 2023-03-12 DIAGNOSIS — Z87891 Personal history of nicotine dependence: Secondary | ICD-10-CM | POA: Insufficient documentation

## 2023-03-12 DIAGNOSIS — G510 Bell's palsy: Secondary | ICD-10-CM | POA: Insufficient documentation

## 2023-03-12 MED ORDER — VALACYCLOVIR HCL 1 G PO TABS: 1000.0000 mg | ORAL_TABLET | Freq: Three times a day (TID) | ORAL | 0 refills | Status: AC

## 2023-03-12 MED ORDER — VALACYCLOVIR HCL 500 MG PO TABS
1000.0000 mg | ORAL_TABLET | ORAL | Status: AC
  Administered 2023-03-12: 1000 mg via ORAL
  Filled 2023-03-12: qty 2

## 2023-03-12 MED ORDER — PREDNISONE 10 MG PO TABS: 80.0000 mg | ORAL_TABLET | Freq: Every day | ORAL | 0 refills | Status: AC

## 2023-03-12 MED ORDER — ARTIFICIAL TEARS OPHTHALMIC OINT
1.0000 | TOPICAL_OINTMENT | Freq: Once | OPHTHALMIC | Status: AC
  Administered 2023-03-12: 1 via OPHTHALMIC
  Filled 2023-03-12 (×3): qty 3.5

## 2023-03-12 MED ORDER — PREDNISONE 20 MG PO TABS
80.0000 mg | ORAL_TABLET | Freq: Once | ORAL | Status: AC
  Administered 2023-03-12: 80 mg via ORAL
  Filled 2023-03-12: qty 4

## 2023-03-12 MED ORDER — HYPROMELLOSE (GONIOSCOPIC) 2.5 % OP SOLN: OPHTHALMIC | 0 refills | Status: AC

## 2023-03-12 NOTE — Discharge Instructions (Addendum)
You have been seen today in the emergency room and diagnosed with Bell's palsy.  I have provided you with a handout to better explain this.  We have discussed the red flags that would concern your enough to return to the emergency room promptly.  Otherwise you will use the medications as we discussed.  You will take the prednisone once daily for the next week.  You will take the Val acyclovir 3 times a day for the next week.  You will use the artificial tears 1 drop every hour while awake and use the artificial tear ointment nightly prior to bed and tape your eye shut for the night. Please follow-up with your primary care provider or urgent care in 1 week if symptoms persist or worsen

## 2023-03-12 NOTE — ED Triage Notes (Signed)
Woke up Friday morning with a odd taste in his mouth and states everything tasted the same. Patient presents with right sided facial numbness that started yesterday. Reports when he tries to spit, it just drips out the right side of his mouth. His eyebrow does not raise like the left side.

## 2023-03-12 NOTE — ED Provider Notes (Signed)
[facility] Emergency Department Provider Note   ____________________________________________   Event Date/Time   First MD Initiated Contact with Patient 03/12/23 1842     (approximate)  I have reviewed the triage vital signs and the nursing notes.   HISTORY  Chief Complaint Numbness    HPI Christorpher Hisaw Villena is a 37 y.o. male presents to the emergency room for complaint of numbness to the right side of his face as well as change in taste. Patient reports that 2 days ago he was not feeling well and that all of his food began to taste the same.  He reports that yesterday he noticed the right side of his mouth was not moving properly.  He reports that he was having difficulty keeping liquids from coming off the side of his mouth.  Patient reports that his symptoms of gotten worse throughout the day today.  At this time he is reporting that all foods and drinks taste the same.  He is unable to feel his lips.  He states that he is having numbness to the entire right side of his face.  He reports that when he attempts to eat or drink the food is coming out the right side of his mouth if he does not make a conscious effort to hold it again.  When he attempted to brush his teeth and rinse his mouth, he was unable to rinse because the water would not stay in his mouth.  Patient denies any weakness or decreased strength in any other part of his body.  Patient denies any change in vision.  However, he does report that he is not able to close his right eye completely.  Patient denies any pain.   History reviewed. No pertinent past medical history.  There are no problems to display for this patient.   History reviewed. No pertinent surgical history.  Prior to Admission medications   Medication Sig Start Date End Date Taking? Authorizing Provider  hydroxypropyl methylcellulose / hypromellose (ISOPTO TEARS / GONIOVISC) 2.5 % ophthalmic solution One drop right eye every hour  while awake 03/12/23  Yes Kalecia Hartney, Pearlie Oyster, NP  predniSONE (DELTASONE) 10 MG tablet Take 8 tablets (80 mg total) by mouth daily for 6 days. 03/12/23 03/18/23 Yes Herschell Dimes, NP  valACYclovir (VALTREX) 1000 MG tablet Take 1 tablet (1,000 mg total) by mouth 3 (three) times daily for 7 days. 03/12/23 03/19/23 Yes Herschell Dimes, NP  HYDROcodone-acetaminophen (NORCO) 5-325 MG tablet Take 1 tablet by mouth every 6 (six) hours as needed for moderate pain. 09/05/16   Irean Hong, MD  ibuprofen (ADVIL,MOTRIN) 600 MG tablet Take 1 tablet (600 mg total) by mouth every 8 (eight) hours as needed. 09/05/16   Irean Hong, MD    Allergies Patient has no known allergies.  History reviewed. No pertinent family history.  Social History Social History   Tobacco Use   Smoking status: Former    Types: E-cigarettes, Cigarettes   Smokeless tobacco: Former  Building services engineer Use: Former  Substance Use Topics   Alcohol use: No   Drug use: No    Review of Systems  Constitutional: No fever/chills.  Noticed to right side of face. Eyes: No visual changes. ENT: Change in taste.  Inability to hold liquids in his mouth.  Denies difficulty swallowing. Cardiovascular: Denies chest pain. Respiratory: Denies shortness of breath. Gastrointestinal: No abdominal pain.  No nausea, no vomiting.  No diarrhea.  No constipation. Genitourinary:  Negative for dysuria. Musculoskeletal: Negative for back pain. Skin: Negative for rash. Neurological: Negative for headaches, focal weakness    ____________________________________________   PHYSICAL EXAM:  VITAL SIGNS: ED Triage Vitals [03/12/23 1838]  Enc Vitals Group     BP (!) 158/97     Pulse Rate 97     Resp 16     Temp 98.8 F (37.1 C)     Temp Source Oral     SpO2 98 %     Weight 220 lb (99.8 kg)     Height 6\' 3"  (1.905 m)     Head Circumference      Peak Flow      Pain Score 0     Pain Loc      Pain Edu?      Excl. in GC?     Constitutional:  Alert and oriented. Well appearing and in no acute distress. Eyes: Conjunctivae are normal. PERRL. EOMI. patient is partially able to lift right eyebrow.  Patient is able to close right eye half way. Ears: TM's normal and pt has no c/o ear pain or difficulty hearing  Head: Atraumatic. Nose: No congestion/rhinnorhea. Mouth/Throat: Mucous membranes are moist.  No difficulty is noted with swallowing.  Patient has noted increased elevation. Neck: No stridor.   Cardiovascular: Normal rate, regular rhythm. Grossly normal heart sounds.  Good peripheral circulation. Respiratory: Normal respiratory effort.  No retractions. Lungs CTAB. Gastrointestinal: Soft and nontender. No distention. No abdominal bruits. No CVA tenderness. Musculoskeletal: Handgrips are equal.  No decreased strength to arms/legs. Neurologic:  Normal speech and language. No gait instability.  Smile is uneven, right-sided.  Patient has difficulty closing right eye. Right eyebrow can partially raise.  Patient sensation is decreased to the right side of face. Skin:  Skin is warm, dry and intact. No rash noted. Psychiatric: Mood and affect are normal. Speech and behavior are normal.  ____________________________________________   LABS (all labs ordered are listed, but only abnormal results are displayed)  Labs Reviewed - No data to display ____________________________________________  EKG   ____________________________________________  RADIOLOGY  ED MD interpretation: CT reviewed by me and read by radiologist.  Official radiology report(s): CT Head Wo Contrast  Result Date: 03/12/2023 CLINICAL DATA:  Facial paralysis EXAM: CT HEAD WITHOUT CONTRAST TECHNIQUE: Contiguous axial images were obtained from the base of the skull through the vertex without intravenous contrast. RADIATION DOSE REDUCTION: This exam was performed according to the departmental dose-optimization program which includes automated exposure control,  adjustment of the mA and/or kV according to patient size and/or use of iterative reconstruction technique. COMPARISON:  02/14/2011 FINDINGS: Brain: There is no mass, hemorrhage or extra-axial collection. The size and configuration of the ventricles and extra-axial CSF spaces are normal. The brain parenchyma is normal, without acute or chronic infarction. Vascular: No abnormal hyperdensity of the major intracranial arteries or dural venous sinuses. No intracranial atherosclerosis. Skull: The visualized skull base, calvarium and extracranial soft tissues are normal. Sinuses/Orbits: No fluid levels or advanced mucosal thickening of the visualized paranasal sinuses. No mastoid or middle ear effusion. The orbits are normal. IMPRESSION: Normal head CT. Electronically Signed   By: Deatra Robinson M.D.   On: 03/12/2023 19:26    ____________________________________________   PROCEDURES  Procedure(s) performed: None  Procedures  Critical Care performed: No  ____________________________________________   INITIAL IMPRESSION / ASSESSMENT AND PLAN / ED COURSE    Ekin D Bennis is a 37 y.o. male presents to the emergency room for complaint of  numbness to the right side of his face as well as change in taste. Patient reports that 2 days ago he was not feeling well and that all of his food began to taste the same.  He reports that yesterday he noticed the right side of his mouth was not moving properly.  He reports that he was having difficulty keeping liquids from coming off the side of his mouth.  Patient reports that his symptoms of gotten worse throughout the day today.  At this time he is reporting that all foods and drinks taste the same.  He is unable to feel his lips.  He states that he is having numbness to the entire right side of his face.  He reports that when he attempts to eat or drink the food is coming out the right side of his mouth if he does not make a conscious effort to hold it again.  When he  attempted to brush his teeth and rinse his mouth, he was unable to rinse because the water would not stay in his mouth.  Patient denies any weakness or decreased strength in any other part of his body.  Patient denies any change in vision.  However, he does report that he is not able to close his right eye completely.  Patient denies any pain.  Will obtain CT of head. CT of head resulted normal.  Based on exam, patient will be diagnosed with Bell's palsy Per up-to-date recommendations will be treated with prednisone 80 mg daily x 1 week, valacyclovir 1000 mg 3 times a day x 1 week, artificial tears 1 drop every hour while awake, artificial tear ointment nightly.  He will also tape his eye shut at night. Patient will follow-up with PCP or urgent care in 1 week if symptoms persist or worsen  Patient will receive his first dose of valacyclovir and prednisone in the emergency room.  I will also provide him with the artificial tear ointment for tonight as pharmacies are closed.  Discussed red flags that patient will need to return to the emergency room for.  He expressed understanding. Patient be discharged home in stable condition at this time.      ____________________________________________   FINAL CLINICAL IMPRESSION(S) / ED DIAGNOSES  Final diagnoses:  Bell's palsy     ED Discharge Orders          Ordered    predniSONE (DELTASONE) 10 MG tablet  Daily        03/12/23 2014    valACYclovir (VALTREX) 1000 MG tablet  3 times daily        03/12/23 2014    hydroxypropyl methylcellulose / hypromellose (ISOPTO TEARS / GONIOVISC) 2.5 % ophthalmic solution        03/12/23 2014             Note:  This document was prepared using Dragon voice recognition software and may include unintentional dictation errors.     Herschell Dimes, NP 03/12/23 2024    Sharyn Creamer, MD 03/13/23 0010
